# Patient Record
Sex: Female | Born: 1965 | Race: Black or African American | Hispanic: No | Marital: Married | Smoking: Never smoker
Health system: Southern US, Community
[De-identification: ages and names within clinical notes are randomized; demographics above are authoritative.]

## PROBLEM LIST (undated history)

## (undated) HISTORY — PX: FEMUR FRACTURE SURGERY: SHX633

---

## 2001-02-28 ENCOUNTER — Inpatient Hospital Stay (HOSPITAL_COMMUNITY): Admission: AD | Admit: 2001-02-28 | Discharge: 2001-02-28 | Payer: Self-pay | Admitting: Obstetrics & Gynecology

## 2001-03-01 ENCOUNTER — Inpatient Hospital Stay (HOSPITAL_COMMUNITY): Admission: AD | Admit: 2001-03-01 | Discharge: 2001-03-02 | Payer: Self-pay | Admitting: *Deleted

## 2003-01-04 ENCOUNTER — Emergency Department (HOSPITAL_COMMUNITY): Admission: EM | Admit: 2003-01-04 | Discharge: 2003-01-04 | Payer: Self-pay | Admitting: Emergency Medicine

## 2003-05-12 ENCOUNTER — Emergency Department (HOSPITAL_COMMUNITY): Admission: EM | Admit: 2003-05-12 | Discharge: 2003-05-12 | Payer: Self-pay | Admitting: Emergency Medicine

## 2009-07-11 ENCOUNTER — Emergency Department (HOSPITAL_COMMUNITY): Admission: EM | Admit: 2009-07-11 | Discharge: 2009-07-11 | Payer: Self-pay | Admitting: Emergency Medicine

## 2010-10-10 NOTE — Discharge Summary (Signed)
[facility]  Patient:    KEYRY, IRACHETA Visit Number: [id number] MRN: [id number]          Service Type: GYN Location: [id number] Attending Physician:  Michaelle Copas Dictated by:   Juanell Fairly, M.D. Admit Date:  03/01/2001 Discharge Date: 03/02/2001                             Discharge Summary  DATE OF BIRTH:                March 22, 1965.  HISTORY/HOSPITAL COURSE:      This is a 45 year old female admitted on October 8 for acute pyelonephritis. The patient had a three-day history of abdominal pain and left flank pain. She presented to the M[facility] Admissions Unit on October 7 with the same symptoms and was given 250 mg q.12h. of ciprofloxacin and one to two tablets of Vicodin every four hours. The patient returned to M[facility] Admissions Unit on March 01, 2001 with worsening of symptoms. The patient stated she had increased pain, increased flank tenderness, and increased burning sensation with urination. On physical exam, the patient had severe CVA tenderness. The rest of her physical exam was within normal limits. Therefore, the patient was admitted for pyelonephritis and placed on ceftriaxone 1 g q.12h. The patient was treated with ceftriaxone on October 8 and October 9. The patient remained afebrile and decrease in pain and a reduction in flank tenderness as well as decrease in dysuria.  DISCHARGE MEDICATIONS:        The patient was discharged on Bactrim DS one tab p.o. b.i.d. x 14 days and Phenergan 12.5 mg one tab p.o. q.6h. p.r.n. for nausea. The Bactrim to finish a 14-day course as the E. coli that grew in her cultures was sensitive to Bactrim.  DISCHARGE DIAGNOSES:          Pyelonephritis.  DISCHARGE FOLLOWUP:           The patient was instructed to follow up with Dr. Valentina Lucks, who is her primary care physician, in two weeks. The patient was instructed to return to [facility] if she had any vomiting,  high temperatures or increase in flank pain. Dictated by:   Juanell Fairly, M.D. Attending Physician:  Michaelle Copas DD:  03/02/01 TD:  03/02/01 Job: [phone] [id number]

## 2015-11-22 ENCOUNTER — Encounter (HOSPITAL_COMMUNITY): Payer: Self-pay | Admitting: Physical Medicine and Rehabilitation

## 2015-11-22 ENCOUNTER — Emergency Department (HOSPITAL_COMMUNITY): Payer: No Typology Code available for payment source

## 2015-11-22 ENCOUNTER — Emergency Department (HOSPITAL_COMMUNITY)
Admission: EM | Admit: 2015-11-22 | Discharge: 2015-11-22 | Disposition: A | Discharge status: Home / Self Care | Payer: No Typology Code available for payment source | Attending: Emergency Medicine | Admitting: Emergency Medicine

## 2015-11-22 DIAGNOSIS — Y999 Unspecified external cause status: Secondary | ICD-10-CM | POA: Insufficient documentation

## 2015-11-22 DIAGNOSIS — Y92410 Unspecified street and highway as the place of occurrence of the external cause: Secondary | ICD-10-CM | POA: Diagnosis not present

## 2015-11-22 DIAGNOSIS — S199XXA Unspecified injury of neck, initial encounter: Secondary | ICD-10-CM | POA: Diagnosis present

## 2015-11-22 DIAGNOSIS — S161XXA Strain of muscle, fascia and tendon at neck level, initial encounter: Secondary | ICD-10-CM | POA: Insufficient documentation

## 2015-11-22 DIAGNOSIS — Y939 Activity, unspecified: Secondary | ICD-10-CM | POA: Diagnosis not present

## 2015-11-22 LAB — I-STAT CHEM 8, ED
BUN: 14 mg/dL (ref 6–20)
CHLORIDE: 103 mmol/L (ref 101–111)
Calcium, Ion: 1.16 mmol/L (ref 1.13–1.30)
Creatinine, Ser: 0.8 mg/dL (ref 0.44–1.00)
Glucose, Bld: 107 mg/dL — ABNORMAL HIGH (ref 65–99)
HCT: 37 % (ref 36.0–46.0)
Hemoglobin: 12.6 g/dL (ref 12.0–15.0)
POTASSIUM: 3.9 mmol/L (ref 3.5–5.1)
SODIUM: 141 mmol/L (ref 135–145)
TCO2: 28 mmol/L (ref 0–100)

## 2015-11-22 LAB — URINALYSIS, ROUTINE W REFLEX MICROSCOPIC
Bilirubin Urine: NEGATIVE
Glucose, UA: NEGATIVE mg/dL
Hgb urine dipstick: NEGATIVE
Ketones, ur: NEGATIVE mg/dL
Leukocytes, UA: NEGATIVE
NITRITE: NEGATIVE
PH: 7 (ref 5.0–8.0)
Protein, ur: NEGATIVE mg/dL
SPECIFIC GRAVITY, URINE: 1.011 (ref 1.005–1.030)

## 2015-11-22 LAB — POC URINE PREG, ED: PREG TEST UR: NEGATIVE

## 2015-11-22 MED ORDER — IBUPROFEN 200 MG PO TABS
400.0000 mg | ORAL_TABLET | Freq: Once | ORAL | Status: AC
  Administered 2015-11-22: 400 mg via ORAL
  Filled 2015-11-22: qty 2
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
## 2020-05-28 ENCOUNTER — Encounter (HOSPITAL_BASED_OUTPATIENT_CLINIC_OR_DEPARTMENT_OTHER): Payer: Self-pay | Admitting: Orthopaedic Surgery

## 2020-05-28 ENCOUNTER — Other Ambulatory Visit: Payer: Self-pay

## 2020-05-30 ENCOUNTER — Other Ambulatory Visit (HOSPITAL_COMMUNITY)
Admission: RE | Admit: 2020-05-30 | Discharge: 2020-05-30 | Disposition: A | Discharge status: Home / Self Care | Payer: 59 | Source: Ambulatory Visit | Attending: Orthopaedic Surgery | Admitting: Orthopaedic Surgery

## 2020-05-30 DIAGNOSIS — U071 COVID-19: Secondary | ICD-10-CM | POA: Insufficient documentation

## 2020-05-30 DIAGNOSIS — Z01812 Encounter for preprocedural laboratory examination: Secondary | ICD-10-CM | POA: Diagnosis present

## 2020-05-30 LAB — SARS CORONAVIRUS 2 (TAT 6-24 HRS): SARS Coronavirus 2: POSITIVE — AB

## 2020-06-03 ENCOUNTER — Ambulatory Visit (HOSPITAL_BASED_OUTPATIENT_CLINIC_OR_DEPARTMENT_OTHER): Admission: RE | Admit: 2020-06-03 | Payer: Self-pay | Source: Home / Self Care | Admitting: Orthopaedic Surgery

## 2020-06-03 SURGERY — RELEASE, CARPAL TUNNEL AND CUBITAL TUNNEL
Anesthesia: Monitor Anesthesia Care | Laterality: Right

## 2020-06-06 ENCOUNTER — Encounter (HOSPITAL_BASED_OUTPATIENT_CLINIC_OR_DEPARTMENT_OTHER): Payer: Self-pay | Admitting: Orthopaedic Surgery

## 2020-06-06 ENCOUNTER — Other Ambulatory Visit: Payer: Self-pay

## 2020-06-10 ENCOUNTER — Other Ambulatory Visit (HOSPITAL_COMMUNITY): Payer: Self-pay

## 2020-06-12 NOTE — Anesthesia Preprocedure Evaluation (Addendum)
Anesthesia Evaluation  Patient identified by MRN, date of birth, ID band Patient awake    Reviewed: Allergy & Precautions, NPO status , Patient's Chart, lab work & pertinent test results  History of Anesthesia Complications Negative for: history of anesthetic complications  Airway Mallampati: II  TM Distance: >3 FB Neck ROM: Full    Dental no notable dental hx.    Pulmonary neg pulmonary ROS,    Pulmonary exam normal        Cardiovascular negative cardio ROS Normal cardiovascular exam     Neuro/Psych negative neurological ROS     GI/Hepatic negative GI ROS, Neg liver ROS,   Endo/Other  negative endocrine ROS  Renal/GU negative Renal ROS     Musculoskeletal negative musculoskeletal ROS (+)   Abdominal   Peds  Hematology negative hematology ROS (+)   Anesthesia Other Findings   Reproductive/Obstetrics                            Anesthesia Physical Anesthesia Plan  ASA: II  Anesthesia Plan: MAC   Post-op Pain Management:  Regional for Post-op pain   Induction:   PONV Risk Score and Plan: 2 and Ondansetron, Dexamethasone and Midazolam  Airway Management Planned:   Additional Equipment:   Intra-op Plan:   Post-operative Plan:   Informed Consent: I have reviewed the patients History and Physical, chart, labs and discussed the procedure including the risks, benefits and alternatives for the proposed anesthesia with the patient or authorized representative who has indicated his/her understanding and acceptance.     Dental advisory given  Plan Discussed with: Anesthesiologist and CRNA  Anesthesia Plan Comments:      Anesthesia Quick Evaluation

## 2020-06-13 ENCOUNTER — Encounter (HOSPITAL_BASED_OUTPATIENT_CLINIC_OR_DEPARTMENT_OTHER): Payer: Self-pay | Admitting: Orthopaedic Surgery

## 2020-06-13 ENCOUNTER — Ambulatory Visit (HOSPITAL_BASED_OUTPATIENT_CLINIC_OR_DEPARTMENT_OTHER)
Admission: RE | Admit: 2020-06-13 | Discharge: 2020-06-13 | Disposition: A | Discharge status: Home / Self Care | Payer: 59 | Attending: Orthopaedic Surgery | Admitting: Orthopaedic Surgery

## 2020-06-13 ENCOUNTER — Ambulatory Visit (HOSPITAL_BASED_OUTPATIENT_CLINIC_OR_DEPARTMENT_OTHER): Payer: 59 | Admitting: Anesthesiology

## 2020-06-13 ENCOUNTER — Other Ambulatory Visit: Payer: Self-pay

## 2020-06-13 ENCOUNTER — Encounter (HOSPITAL_BASED_OUTPATIENT_CLINIC_OR_DEPARTMENT_OTHER)
Admission: RE | Disposition: A | Discharge status: Home / Self Care | Payer: Self-pay | Source: Home / Self Care | Attending: Orthopaedic Surgery

## 2020-06-13 DIAGNOSIS — G5601 Carpal tunnel syndrome, right upper limb: Secondary | ICD-10-CM | POA: Insufficient documentation

## 2020-06-13 DIAGNOSIS — G5621 Lesion of ulnar nerve, right upper limb: Secondary | ICD-10-CM | POA: Diagnosis not present

## 2020-06-13 HISTORY — PX: CARPAL TUNNEL WITH CUBITAL TUNNEL: SHX5608

## 2020-06-13 SURGERY — RELEASE, CARPAL TUNNEL AND CUBITAL TUNNEL
Anesthesia: Monitor Anesthesia Care | Site: Arm Upper | Laterality: Right

## 2020-06-13 MED ORDER — PROPOFOL 500 MG/50ML IV EMUL
INTRAVENOUS | Status: DC | PRN
  Administered 2020-06-13: 75 ug/kg/min via INTRAVENOUS

## 2020-06-13 MED ORDER — CEFAZOLIN SODIUM-DEXTROSE 2-4 GM/100ML-% IV SOLN
INTRAVENOUS | Status: AC
  Filled 2020-06-13: qty 100

## 2020-06-13 MED ORDER — HYDROCODONE-ACETAMINOPHEN 5-325 MG PO TABS: 1.0000 | ORAL_TABLET | Freq: Four times a day (QID) | ORAL | 0 refills | Status: AC | PRN

## 2020-06-13 MED ORDER — CEFAZOLIN SODIUM-DEXTROSE 2-4 GM/100ML-% IV SOLN
2.0000 g | INTRAVENOUS | Status: AC
  Administered 2020-06-13: 2 g via INTRAVENOUS

## 2020-06-13 MED ORDER — MIDAZOLAM HCL 2 MG/2ML IJ SOLN
2.0000 mg | Freq: Once | INTRAMUSCULAR | Status: AC
  Administered 2020-06-13: 2 mg via INTRAVENOUS

## 2020-06-13 MED ORDER — MIDAZOLAM HCL 2 MG/2ML IJ SOLN
INTRAMUSCULAR | Status: AC
  Filled 2020-06-13: qty 2

## 2020-06-13 MED ORDER — FENTANYL CITRATE (PF) 100 MCG/2ML IJ SOLN
INTRAMUSCULAR | Status: AC
  Filled 2020-06-13: qty 2

## 2020-06-13 MED ORDER — PROPOFOL 10 MG/ML IV BOLUS
INTRAVENOUS | Status: DC | PRN
  Administered 2020-06-13: 30 mg via INTRAVENOUS

## 2020-06-13 MED ORDER — PROPOFOL 500 MG/50ML IV EMUL
INTRAVENOUS | Status: AC
  Filled 2020-06-13: qty 100

## 2020-06-13 MED ORDER — HYDROCODONE-ACETAMINOPHEN 5-325 MG PO TABS: 1.0000 | ORAL_TABLET | Freq: Four times a day (QID) | ORAL | 0 refills | Status: DC | PRN

## 2020-06-13 MED ORDER — BUPIVACAINE HCL (PF) 0.5 % IJ SOLN
INTRAMUSCULAR | Status: AC
  Filled 2020-06-13: qty 30

## 2020-06-13 MED ORDER — LIDOCAINE 2% (20 MG/ML) 5 ML SYRINGE
INTRAMUSCULAR | Status: AC
  Filled 2020-06-13: qty 5

## 2020-06-13 MED ORDER — FENTANYL CITRATE (PF) 100 MCG/2ML IJ SOLN
100.0000 ug | Freq: Once | INTRAMUSCULAR | Status: AC
  Administered 2020-06-13: 100 ug via INTRAVENOUS

## 2020-06-13 MED ORDER — LACTATED RINGERS IV SOLN: INTRAVENOUS | Status: DC

## 2020-06-13 MED ORDER — BUPIVACAINE HCL (PF) 0.5 % IJ SOLN
INTRAMUSCULAR | Status: DC | PRN
  Administered 2020-06-13: 30 mL via PERINEURAL

## 2020-06-13 MED ORDER — 0.9 % SODIUM CHLORIDE (POUR BTL) OPTIME
TOPICAL | Status: DC | PRN
  Administered 2020-06-13: 1000 mL

## 2020-06-13 MED ORDER — ONDANSETRON HCL 4 MG/2ML IJ SOLN
INTRAMUSCULAR | Status: AC
  Filled 2020-06-13: qty 2

## 2020-06-13 MED ORDER — BUPIVACAINE HCL (PF) 0.25 % IJ SOLN
INTRAMUSCULAR | Status: AC
  Filled 2020-06-13: qty 30

## 2020-06-13 MED ORDER — ONDANSETRON HCL 4 MG/2ML IJ SOLN
INTRAMUSCULAR | Status: DC | PRN
  Administered 2020-06-13: 4 mg via INTRAVENOUS

## 2020-06-13 MED ORDER — LIDOCAINE 2% (20 MG/ML) 5 ML SYRINGE
INTRAMUSCULAR | Status: DC | PRN
  Administered 2020-06-13: 40 mg via INTRAVENOUS

## 2020-06-13 SURGICAL SUPPLY — 38 items
APL PRP STRL LF DISP 70% ISPRP (MISCELLANEOUS) ×1
BNDG CMPR 9X4 STRL LF SNTH (GAUZE/BANDAGES/DRESSINGS) ×1
BNDG ELASTIC 3X5.8 VLCR STR LF (GAUZE/BANDAGES/DRESSINGS) IMPLANT
BNDG ELASTIC 4X5.8 VLCR STR LF (GAUZE/BANDAGES/DRESSINGS) IMPLANT
BNDG ESMARK 4X9 LF (GAUZE/BANDAGES/DRESSINGS) ×2 IMPLANT
BNDG GAUZE ELAST 4 BULKY (GAUZE/BANDAGES/DRESSINGS) IMPLANT
CHLORAPREP W/TINT 26 (MISCELLANEOUS) ×2 IMPLANT
CORD BIPOLAR FORCEPS 12FT (ELECTRODE) ×2 IMPLANT
COVER SURGICAL LIGHT HANDLE (MISCELLANEOUS) ×2 IMPLANT
COVER WAND RF STERILE (DRAPES) ×2 IMPLANT
CUFF TOURN SGL QUICK 18X4 (TOURNIQUET CUFF) IMPLANT
CUFF TOURN SGL QUICK 24 (TOURNIQUET CUFF)
CUFF TRNQT CYL 24X4X16.5-23 (TOURNIQUET CUFF) IMPLANT
DRAPE EXTREMITY T 121X128X90 (DISPOSABLE) ×2 IMPLANT
DRAPE SURG 17X23 STRL (DRAPES) ×2 IMPLANT
GAUZE SPONGE 4X4 12PLY STRL (GAUZE/BANDAGES/DRESSINGS) IMPLANT
GAUZE XEROFORM 1X8 LF (GAUZE/BANDAGES/DRESSINGS) IMPLANT
GLOVE SRG 8 PF TXTR STRL LF DI (GLOVE) ×1 IMPLANT
GLOVE SURG SYN 7.5  E (GLOVE) ×2
GLOVE SURG SYN 7.5 E (GLOVE) ×1 IMPLANT
GLOVE SURG UNDER POLY LF SZ8 (GLOVE) ×2
GOWN STRL REUS W/ TWL LRG LVL3 (GOWN DISPOSABLE) ×2 IMPLANT
GOWN STRL REUS W/TWL LRG LVL3 (GOWN DISPOSABLE) ×4
KIT TURNOVER KIT B (KITS) ×2 IMPLANT
KNIFE CARPAL TUNNEL (BLADE) ×2 IMPLANT
NS IRRIG 1000ML POUR BTL (IV SOLUTION) ×2 IMPLANT
PACK BASIN DAY SURGERY FS (CUSTOM PROCEDURE TRAY) ×2 IMPLANT
PAD ARMBOARD 7.5X6 YLW CONV (MISCELLANEOUS) ×4 IMPLANT
PAD CAST 4YDX4 CTTN HI CHSV (CAST SUPPLIES) IMPLANT
PADDING CAST COTTON 4X4 STRL (CAST SUPPLIES)
SLING ARM FOAM STRAP LRG (SOFTGOODS) ×2 IMPLANT
SLING ARM FOAM STRAP MED (SOFTGOODS) IMPLANT
SPLINT FIBERGLASS 4X30 (CAST SUPPLIES) IMPLANT
SUT PROLENE 4 0 PS 2 18 (SUTURE) ×4 IMPLANT
SUT VIC AB 3-0 SH 27 (SUTURE) ×2
SUT VIC AB 3-0 SH 27X BRD (SUTURE) ×1 IMPLANT
TOWEL GREEN STERILE FF (TOWEL DISPOSABLE) ×2 IMPLANT
UNDERPAD 30X36 HEAVY ABSORB (UNDERPADS AND DIAPERS) ×2 IMPLANT

## 2020-06-13 NOTE — Discharge Instructions (Signed)
Discharge Instructions  - Keep dressings in place. Do not remove them. - The dressings must stay dry - Take all medication as prescribed. Transition to over the counter pain medication as your pain improves - Keep the hand elevated over the next 48-72 hours to help with pain and swelling - Move all digits not restricted by the dressings regularly to prevent stiffness - Please call to schedule a follow up appointment with Dr. Creighton and therapy at [phone] for 10-14 days following surgery - Your pain medication have been sent digitally to your pharmacy   Post Anesthesia Home Care Instructions  Activity: Get plenty of rest for the remainder of the day. A responsible individual must stay with you for 24 hours following the procedure.  For the next 24 hours, DO NOT: -Drive a car -Operate machinery -Drink alcoholic beverages -Take any medication unless instructed by your physician -Make any legal decisions or sign important papers.  Meals: Start with liquid foods such as gelatin or soup. Progress to regular foods as tolerated. Avoid greasy, spicy, heavy foods. If nausea and/or vomiting occur, drink only clear liquids until the nausea and/or vomiting subsides. Call your physician if vomiting continues.  Special Instructions/Symptoms: Your throat may feel dry or sore from the anesthesia or the breathing tube placed in your throat during surgery. If this causes discomfort, gargle with warm salt water. The discomfort should disappear within 24 hours.  If you had a scopolamine patch placed behind your ear for the management of post- operative nausea and/or vomiting:  1. The medication in the patch is effective for 72 hours, after which it should be removed.  Wrap patch in a tissue and discard in the trash. Wash hands thoroughly with soap and water. 2. You may remove the patch earlier than 72 hours if you experience unpleasant side effects which may include dry mouth, dizziness or visual  disturbances. 3. Avoid touching the patch. Wash your hands with soap and water after contact with the patch.    Regional Anesthesia Blocks  1. Numbness or the inability to move the "blocked" extremity may last from 3-48 hours after placement. The length of time depends on the medication injected and your individual response to the medication. If the numbness is not going away after 48 hours, call your surgeon.  2. The extremity that is blocked will need to be protected until the numbness is gone and the  Strength has returned. Because you cannot feel it, you will need to take extra care to avoid injury. Because it may be weak, you may have difficulty moving it or using it. You may not know what position it is in without looking at it while the block is in effect.  3. For blocks in the legs and feet, returning to weight bearing and walking needs to be done carefully. You will need to wait until the numbness is entirely gone and the strength has returned. You should be able to move your leg and foot normally before you try and bear weight or walk. You will need someone to be with you when you first try to ensure you do not fall and possibly risk injury.  4. Bruising and tenderness at the needle site are common side effects and will resolve in a few days.  5. Persistent numbness or new problems with movement should be communicated to the surgeon or the [facility] ([phone])/ [facility] ([phone]). 

## 2020-06-13 NOTE — Transfer of Care (Signed)
Immediate Anesthesia Transfer of Care Note  Patient: Carolyn Krause  Procedure(s) Performed: Right carpal tunnel release and cubital tunnel release (Right Arm Upper)  Patient Location: PACU  Anesthesia Type:MAC  Level of Consciousness: awake, alert , oriented and patient cooperative  Airway & Oxygen Therapy: Patient Spontanous Breathing and Patient connected to nasal cannula oxygen  Post-op Assessment: Report given to RN and Post -op Vital signs reviewed and stable  Post vital signs: Reviewed and stable  Last Vitals:  Vitals Value Taken Time  BP    Temp    Pulse    Resp    SpO2      Last Pain:  Vitals:   06/13/20 0642  TempSrc: Oral  PainSc: 0-No pain      Patients Stated Pain Goal: 3 (74/94/49 6759)  Complications: No complications documented.

## 2020-06-13 NOTE — H&P (Signed)
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 female who complains of right carpal and cubital tunnel syndrome.  She has been followed closely by me in clinic for quite some time.  She has had persistent paresthesias into the right hand including both the median and ulnar nerve distributions.  She had significant pain and hypersensitivity to both nerves at the carpal and cubital tunnels respectively.  She did have an EMG which showed relatively normal findings.  Despite this we discussed treatment options going forward including both operative and nonoperative treatment measures.  She did wish to proceed forward with surgical intervention required both the right carpal and cubital tunnel release.  We discussed risks of surgery in clinic and after having this discussion she agreed to proceed and presents today for operative fixation of her right arm.  History reviewed. No pertinent past medical history. Past Surgical History:  Procedure Laterality Date  . FEMUR FRACTURE SURGERY Left    Social History   Socioeconomic History  . Marital status: Married    Spouse name: Not on file  . Number of children: Not on file  . Years of education: Not on file  . Highest education level: Not on file  Occupational History  . Not on file  Tobacco Use  . Smoking status: Never Smoker  . Smokeless tobacco: Never Used  Substance and Sexual Activity  . Alcohol use: Not Currently  . Drug use: No  . Sexual activity: Not on file  Other Topics Concern  . Not on file  Social History Narrative  . Not on file   Social Determinants of Health   Financial Resource Strain: Not on file  Food Insecurity: Not on file  Transportation Needs: Not on file  Physical Activity: Not on file  Stress: Not on file  Social Connections: Not on file   History reviewed. No pertinent family history. No Known Allergies Prior  to Admission medications   Medication Sig Start Date End Date Taking? Authorizing Provider  Multiple Vitamin (MULTIVITAMIN) tablet Take 1 tablet by mouth daily.   Yes [provider]   No results found.  Positive ROS: All other systems have been reviewed and were otherwise negative with the exception of those mentioned in the HPI and as above.  Physical Exam: General: Alert, no acute distress Cardiovascular: No edema Respiratory: No cyanosis, no use of accessory musculature Skin: No lesions in the area of chief complaint  Psychiatric: Patient is competent for consent with normal mood and affect   Neurologic: Normal sensation to the right ulnar nerve distribution, radial nerve distribution, and median nerve distribution. Positive Tinel's and Phalen's/Durkan's tests. Ulnar Nerve Compression Test at Elbow is positive  Wrists: Examination of the right upper extremity shows a grossly normal appearing right wrist and hand. There is no swelling, erythema or signs of infection. Patient has tenderness palpation along the palm. This is most significant ulnarly. There is no significant lesions or swelling noted in the hand or palm today. She has sensitivity once again to the ulnar aspect of the palm as well as along the course of the ulnar nerve to the medial elbow. She does have significant tenderness to palpation to the ulnar nerve on her exam today.. No tenderness to palpation in her fingertips.  Assessment: Right carpal and cubital tunnel syndrome  Plan: Plan to proceed forward with right carpal tunnel release  and right cubital tunnel release with possible anterior transposition.  Risk of surgery were once again discussed with patient today.  These risks include but are not limited to infection, bleeding, damage to surrounding structures including blood vessels and nerves, pain, stiffness, recurrence, incomplete neurologic recovery and need for additional procedures.  Informed consent  was obtained and the patient's right arm was marked.  Discharge postoperatively and follow-up with me in approximately 10 to 14 days.    Ernest Mallick, MD [phone]   06/13/2020 7:09 AM

## 2020-06-13 NOTE — Anesthesia Procedure Notes (Signed)
Anesthesia Regional Block: Supraclavicular block   Pre-Anesthetic Checklist: ,, timeout performed, Correct Patient, Correct Site, Correct Laterality, Correct Procedure, Correct Position, site marked, Risks and benefits discussed,  Surgical consent,  Pre-op evaluation,  At surgeon's request and post-op pain management  Laterality: Right  Prep: chloraprep       Needles:  Injection technique: Single-shot  Needle Type: Echogenic Stimulator Needle     Needle Length: 5cm  Needle Gauge: 22     Additional Needles:   Narrative:  Start time: 06/13/2020 6:41 AM End time: 06/13/2020 6:51 AM Injection made incrementally with aspirations every 5 mL.  Performed by: Personally  Anesthesiologist: Heather Roberts, MD  Additional Notes: Functioning IV was confirmed and monitors applied.  A 72mm 22ga echogenic arrow stimulator was used. Sterile prep and drape,hand hygiene and sterile gloves were used.Ultrasound guidance: relevant anatomy identified, needle position confirmed, local anesthetic spread visualized around nerve(s)., vascular puncture avoided.  Image printed for medical record.  Negative aspiration and negative test dose prior to incremental administration of local anesthetic. The patient tolerated the procedure well.

## 2020-06-13 NOTE — Progress Notes (Signed)
Assisted Dr. Singer with right, ultrasound guided, supraclavicular block. Side rails up, monitors on throughout procedure. See vital signs in flow sheet. Tolerated Procedure well. 

## 2020-06-13 NOTE — Anesthesia Postprocedure Evaluation (Signed)
Anesthesia Post Note  Patient: Carolyn Krause  Procedure(s) Performed: Right carpal tunnel release and cubital tunnel release (Right Arm Upper)     Patient location during evaluation: PACU Anesthesia Type: MAC and Regional Level of consciousness: awake and alert Pain management: pain level controlled Vital Signs Assessment: post-procedure vital signs reviewed and stable Respiratory status: spontaneous breathing and respiratory function stable Cardiovascular status: stable Postop Assessment: no apparent nausea or vomiting Anesthetic complications: no   No complications documented.  Last Vitals:  Vitals:   06/13/20 0830 06/13/20 0841  BP: 99/75 117/77  Pulse: 79 91  Resp: 17 16  Temp:  36.5 C  SpO2: 95% 98%    Last Pain:  Vitals:   06/13/20 0841  TempSrc:   PainSc: 0-No pain                 Faizon Capozzi DANIEL

## 2020-06-13 NOTE — Op Note (Signed)
PREOPERATIVE DIAGNOSIS: Right carpal and cubital tunnel syndrome  POSTOPERATIVE DIAGNOSIS: Same  ATTENDING PHYSICIAN: Maudry Mayhew. Jeannie Fend, III, MD who was present and scrubbed for the entire case   ASSISTANT SURGEON: None.   ANESTHESIA: Regional with MAC  SURGICAL PROCEDURES: 1.  Right carpal tunnel release 2.  Right cubital tunnel release, in situ decompression  SURGICAL INDICATIONS: Patient is a 55 year old female who was seen evaluated by me in clinic.  She had a long history of right arm pain as well as intermittent paresthesias.  She had a clinical diagnosis of both right carpal and cubital tunnel syndrome.  She did have an EMG which was overall normal showing minimal compression of either the median or ulnar nerve.  We had a long discussion regarding treatment options going forward with her right arm.  As she underwent extensive conservative treatment with brace immobilization, activity modification and anti-inflammatories without significant provement, she did wish to proceed forward with a right carpal and cubital tunnel release and presents today for that.  FINDING: Compression of the median nerve by the transverse carpal ligament at the carpal tunnel.  Successful carpal tunnel release was performed.  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  Following focal cubital tunnel release, the ulnar nerve was stable and remained posterior to the medial epicondyle.  DESCRIPTION OF PROCEDURE: Patient was identified in the preoperative holding area where the risk benefits and alternatives of the procedure were once again discussed with the patient.  These risks include but are not limited to infection, bleeding, damage to surrounding structures including blood vessels and nerves, pain, stiffness, recurrence, incomplete neurologic recovery and need for additional procedures.  Informed consent was obtained at that time the patient's right arm was marked  with a surgical marking pen.  She then underwent a right upper extremity plexus block by anesthesia.  She was then brought to the operative suite where timeout is performed identifying the correct patient operative site.  She was positioned supine on the operative table with her hand outstretched on a hand table.  A tourniquet is placed on the upper arm.  She was induced under MAC sedation and preoperative antibiotics were administered.  The right upper extremity was then prepped and draped in usual sterile fashion.  The limb was exsanguinated and the tourniquet was inflated.  A longitudinal incision was made in the palm in line with the fourth ray.  Sharp dissection was carried down through the subcutaneous tissues.  The palmar aponeurosis was identified and split in line with the surgical incision.  The transverse fibers of the transverse carpal ligament were then identified.  15 blade was used to incise the ligament in its midportion.  This was extended distally until full complete distal release the ligament was confirmed with visualization of the palmar fat.  Blunt dissection was then performed palmar to the transverse carpal ligament, bluntly dissecting into the distal forearm.  The KM I carpal tunnel release guide was then inserted into the carpal tunnel.  Care was taken to maintain direct pressure on the undersurface of the ligament as it was slowly advanced into the distal forearm.  Once in appropriate position, the blade was inserted into the guide and slowly advance.  This released the remaining, proximal portion of the transverse carpal ligament.  This was confirmed under direct visualization.  The median nerve was also closely inspected and found to be fully intact without evidence of injury.  The radial and ulnar leaflets of the  transverse carpal ligament were then gently elevated with a Soil scientist.  The wound was copiously irrigated with normal saline and skin was closed with interrupted 4-0  Prolene sutures.  Attention was then turned to the medial elbow.  A curved incision was made centered around the medial epicondyle.  Blunt dissection was carried down through the subcutaneous tissues.  Crossing superficial neurologic structures were protected with retractors.  Continued blunt dissection was performed through subcutaneous tissues down to the level of the medial epicondyle.  Blunt dissection was performed proximally and the ulnar nerve was found proximal to the medial epicondyle along the course of the medial intermuscular septum.  Scissors were used to carefully incise the fascial layer over top of the nerve working proximally up to the arcade of Struthers.  Continued release of the ulnar nerve was then performed as it coursed posterior to the medial epicondyle.  This was done by carefully incising Osborne's ligament.  This was thickened and compressing the nerve.  The nerve was then traced as it entered into the 2 heads of the FCU.  The superficial fascia was then incised and the 2 heads of the muscle bellies were then bluntly dissected.  The deep fascial layer was then also incised down to the first motor branch of the FCU.  This provided full complete release of the ulnar nerve throughout the medial elbow.  The arm was then taken through a series of flexion and extension motions.  There is a nerve root remained stable, posterior to the medial epicondyle thus an in situ decompression was performed.  At this point the wound was copiously irrigated with normal saline.  The skin was closed with deep interrupted 3-0 Vicryl sutures followed by 4-0 Prolene sutures.  Xeroform and 4 x 4's were placed on both incisions followed by application of a well-padded long-arm splint.  The tourniquet was released and the patient had return of brisk capillary refill to all of her digits.  She was awoken from her anesthesia and taken to the PACU in stable condition.  She tolerated the procedure well and there were  no complications.  ESTIMATED BLOOD LOSS: 10 mL  TOURNIQUET TIME: 27 minutes  SPECIMENS: None  POSTOPERATIVE PLAN: The patient will be discharged home and seen back  in the office in approximately 10-12 days for wound check, suture  removal, and then be sent to a therapist for standard postoperative protocol following carpal and cubital tunnel releases with early range of motion and strengthening at approximately 4 to 6 weeks pending her clinical improvement.  IMPLANTS: None

## 2020-06-14 ENCOUNTER — Encounter (HOSPITAL_BASED_OUTPATIENT_CLINIC_OR_DEPARTMENT_OTHER): Payer: Self-pay | Admitting: Orthopaedic Surgery

## 2021-06-01 ENCOUNTER — Other Ambulatory Visit: Payer: Self-pay

## 2021-06-01 ENCOUNTER — Ambulatory Visit
Admission: EM | Admit: 2021-06-01 | Discharge: 2021-06-01 | Disposition: A | Discharge status: Home / Self Care | Payer: Self-pay

## 2021-06-02 ENCOUNTER — Ambulatory Visit: Payer: Self-pay

## 2023-12-05 ENCOUNTER — Encounter (HOSPITAL_COMMUNITY): Payer: Self-pay

## 2023-12-05 ENCOUNTER — Ambulatory Visit (HOSPITAL_COMMUNITY)
Admission: EM | Admit: 2023-12-05 | Discharge: 2023-12-05 | Disposition: A | Discharge status: Home / Self Care | Payer: PRIVATE HEALTH INSURANCE | Attending: Family Medicine | Admitting: Family Medicine

## 2023-12-05 DIAGNOSIS — K0889 Other specified disorders of teeth and supporting structures: Secondary | ICD-10-CM

## 2023-12-05 DIAGNOSIS — K047 Periapical abscess without sinus: Secondary | ICD-10-CM | POA: Diagnosis not present

## 2023-12-05 MED ORDER — OXYCODONE HCL 5 MG PO TABS: 5.0000 mg | ORAL_TABLET | Freq: Two times a day (BID) | ORAL | 0 refills | Status: AC | PRN

## 2023-12-05 MED ORDER — AMOXICILLIN 500 MG PO CAPS
500.0000 mg | ORAL_CAPSULE | Freq: Three times a day (TID) | ORAL | 0 refills | Status: AC
Start: 2023-12-05 — End: 2023-12-12

## 2023-12-05 NOTE — ED Provider Notes (Addendum)
 [facility]    CSN: [id number] Arrival date & time: 12/05/23  1003      History   Chief Complaint No chief complaint on file.   HPI Carolyn Krause is a 58 y.o. female.   The history is provided by the patient. No language interpreter was used.  Dental Pain Location:  Lower (Right lower jaw pain with swelling developed overnight. Missing molar and premolar on same area) Quality:  Sharp Onset quality:  Sudden (She was doing well and then woke up with gum and jaw pain) Timing:  Constant Progression:  Worsening (Jaw pain now moved a bit to the right side of her neck) Chronicity:  New Context: normal dentition   Context comment:  Molar and prmolar were removed when she was younger. She is not sure why. No hx of dental problem Prior workup: None. Relieved by: Gurgle salt water made it better last night, but this morning, it got worse. Worsened by:  Nothing Associated symptoms: gum swelling   Associated symptoms: no congestion, no difficulty swallowing, no fever and no neck pain   Associated symptoms comment:  Right jaw swelling and pain Risk factors: no alcohol problem, no diabetes, sufficient dental care and no smoking     History reviewed. No pertinent past medical history.  There are no active problems to display for this patient.   Past Surgical History:  Procedure Laterality Date   CARPAL TUNNEL WITH CUBITAL TUNNEL Right 06/13/2020   Procedure: Right carpal tunnel release and cubital tunnel release;  Surgeon: Carolee Lynwood JINNY DOUGLAS, MD;  Location: [facility];  Service: Orthopedics;  Laterality: Right;   FEMUR FRACTURE SURGERY Left     OB History   No obstetric history on file.      Home Medications    Prior to Admission medications   Medication Sig Start Date End Date Taking? Authorizing Provider  amoxicillin  (AMOXIL ) 500 MG capsule Take 1 capsule (500 mg total) by mouth 3 (three) times daily for 7 days. 12/05/23 12/12/23 Yes Anders Otto DASEN, MD  oxyCODONE  (ROXICODONE ) 5 MG immediate release tablet Take 1 tablet (5 mg total) by mouth every 12 (twelve) hours as needed for severe pain (pain score 7-10). 12/05/23  Yes Anders Otto DASEN, MD  Multiple Vitamin (MULTIVITAMIN) tablet Take 1 tablet by mouth daily.    [provider]    Family History History reviewed. No pertinent family history.  Social History Social History   Tobacco Use   Smoking status: Never   Smokeless tobacco: Never  Substance Use Topics   Alcohol use: Not Currently   Drug use: No     Allergies   Patient has no known allergies.   Review of Systems Review of Systems  Constitutional:  Negative for fever.  HENT:  Negative for congestion.   Musculoskeletal:  Negative for neck pain.     Physical Exam Triage Vital Signs ED Triage Vitals [12/05/23 1024]  Encounter Vitals Group     BP 120/84     Girls Systolic BP Percentile      Girls Diastolic BP Percentile      Boys Systolic BP Percentile      Boys Diastolic BP Percentile      Pulse Rate 97     Resp 18     Temp 98.2 F (36.8 C)     Temp Source Oral     SpO2 98 %     Weight      Height  Head Circumference      Peak Flow      Pain Score      Pain Loc      Pain Education      Exclude from Growth Chart    No data found.  Updated Vital Signs BP 120/84 (BP Location: Left Arm)   Pulse 97   Temp 98.2 F (36.8 C) (Oral)   Resp 18   LMP 11/14/2015   SpO2 98%   Visual Acuity Right Eye Distance:   Left Eye Distance:   Bilateral Distance:    Right Eye Near:   Left Eye Near:    Bilateral Near:     Physical Exam Vitals and nursing note reviewed.  Constitutional:      Appearance: She is not ill-appearing or toxic-appearing.  HENT:     Head:     Comments: No gum erythema, missing right molar and premolar with tender gum. No discharge. Mild submandibular swelling and moderate tenderness over her right mandible. No facial swelling or erythema     Mouth/Throat:     Mouth: Mucous membranes are moist.     Pharynx: Oropharynx is clear. No oropharyngeal exudate or posterior oropharyngeal erythema.  Neck:     Comments: Neg lymphadenopathy Cardiovascular:     Rate and Rhythm: Normal rate and regular rhythm.     Heart sounds: Normal heart sounds. No murmur heard. Pulmonary:     Effort: Pulmonary effort is normal. No respiratory distress.     Breath sounds: Normal breath sounds. No wheezing.  Musculoskeletal:     Cervical back: Neck supple. No rigidity or tenderness.  Lymphadenopathy:     Cervical: No cervical adenopathy.      UC Treatments / Results  Labs (all labs ordered are listed, but only abnormal results are displayed) Labs Reviewed - No data to display  EKG   Radiology No results found.  Procedures Procedures (including critical care time)  Medications Ordered in UC Medications - No data to display  Initial Impression / Assessment and Plan / UC Course  I have reviewed the triage vital signs and the nursing notes.  Pertinent labs & imaging results that were available during my care of the patient were reviewed by me and considered in my medical decision making (see chart for details).  Clinical Course as of 12/05/23 1121  Sun Dec 05, 2023  1104 Dental abscess - mild IM pain meds declined Oxycodone  prn pain escribed Amoxicillin  escribed F/U with dentist soon and return as needed for reassessment She agreed with the plan [KE]    Clinical Course User Index [KE] Anders Otto DASEN, MD     Final Clinical Impressions(s) / UC Diagnoses   Final diagnoses:  Pain, dental  Dental abscess     Discharge Instructions      It was nice seeing you. I am sorry about your dental pain. I sent pain medication and antibiotic to your pharmacy. Please call your dentist tomorrow for reassessment. See us  soon if no improvement.      ED Prescriptions     Medication Sig Dispense Auth. Provider   oxyCODONE   (ROXICODONE ) 5 MG immediate release tablet Take 1 tablet (5 mg total) by mouth every 12 (twelve) hours as needed for severe pain (pain score 7-10). 14 tablet Rajohn Henery, Otto T, MD   amoxicillin  (AMOXIL ) 500 MG capsule Take 1 capsule (500 mg total) by mouth 3 (three) times daily for 7 days. 21 capsule Anders Otto DASEN, MD  I have reviewed the PDMP during this encounter.   Anders Otto DASEN, MD 12/05/23 1105    Anders Otto DASEN, MD 12/05/23 1121

## 2023-12-05 NOTE — ED Triage Notes (Signed)
 Patient reports to the office for right side facial pain and swelling x 1 day.

## 2023-12-05 NOTE — Discharge Instructions (Signed)
 It was nice seeing you. I am sorry about your dental pain. I sent pain medication and antibiotic to your pharmacy. Please call your dentist tomorrow for reassessment. See us  soon if no improvement.
